# Patient Record
Sex: Female | Born: 1965 | Race: White | Hispanic: No | Smoking: Current every day smoker
Health system: Southern US, Community
[De-identification: ages and names within clinical notes are randomized; demographics above are authoritative.]

## PROBLEM LIST (undated history)

## (undated) DIAGNOSIS — F419 Anxiety disorder, unspecified: Secondary | ICD-10-CM

## (undated) DIAGNOSIS — B1920 Unspecified viral hepatitis C without hepatic coma: Secondary | ICD-10-CM

## (undated) DIAGNOSIS — M069 Rheumatoid arthritis, unspecified: Secondary | ICD-10-CM

## (undated) HISTORY — PX: KNEE SURGERY: SHX244

---

## 2005-02-17 ENCOUNTER — Emergency Department: Payer: Self-pay | Admitting: Emergency Medicine

## 2005-03-30 ENCOUNTER — Other Ambulatory Visit: Payer: Self-pay

## 2005-03-30 ENCOUNTER — Inpatient Hospital Stay: Payer: Self-pay | Admitting: Psychiatry

## 2006-01-11 ENCOUNTER — Inpatient Hospital Stay: Payer: Self-pay | Admitting: Psychiatry

## 2006-07-25 ENCOUNTER — Emergency Department: Payer: Self-pay | Admitting: Emergency Medicine

## 2006-07-29 ENCOUNTER — Emergency Department (HOSPITAL_COMMUNITY): Admission: EM | Admit: 2006-07-29 | Discharge: 2006-07-29 | Payer: Self-pay | Admitting: Emergency Medicine

## 2007-03-10 IMAGING — CR DG ELBOW COMPLETE 3+V*L*
1 series · 2 of 2 positions shown · non-contrast
Comparison: none

REASON FOR EXAM: Fall
COMMENTS:  LMP: > one month ago

PROCEDURE:     DXR - DXR ELBOW LT COMP W/OBLIQUES  - February 17, 2005  [DATE]
RESULT:     Four views of the LEFT elbow show no evidence of fracture,
dislocation, or radiopaque foreign body.

[Series 1: view not recorded · 0.17mm/px · 2 of 2 slices shown]
[im 1/2]
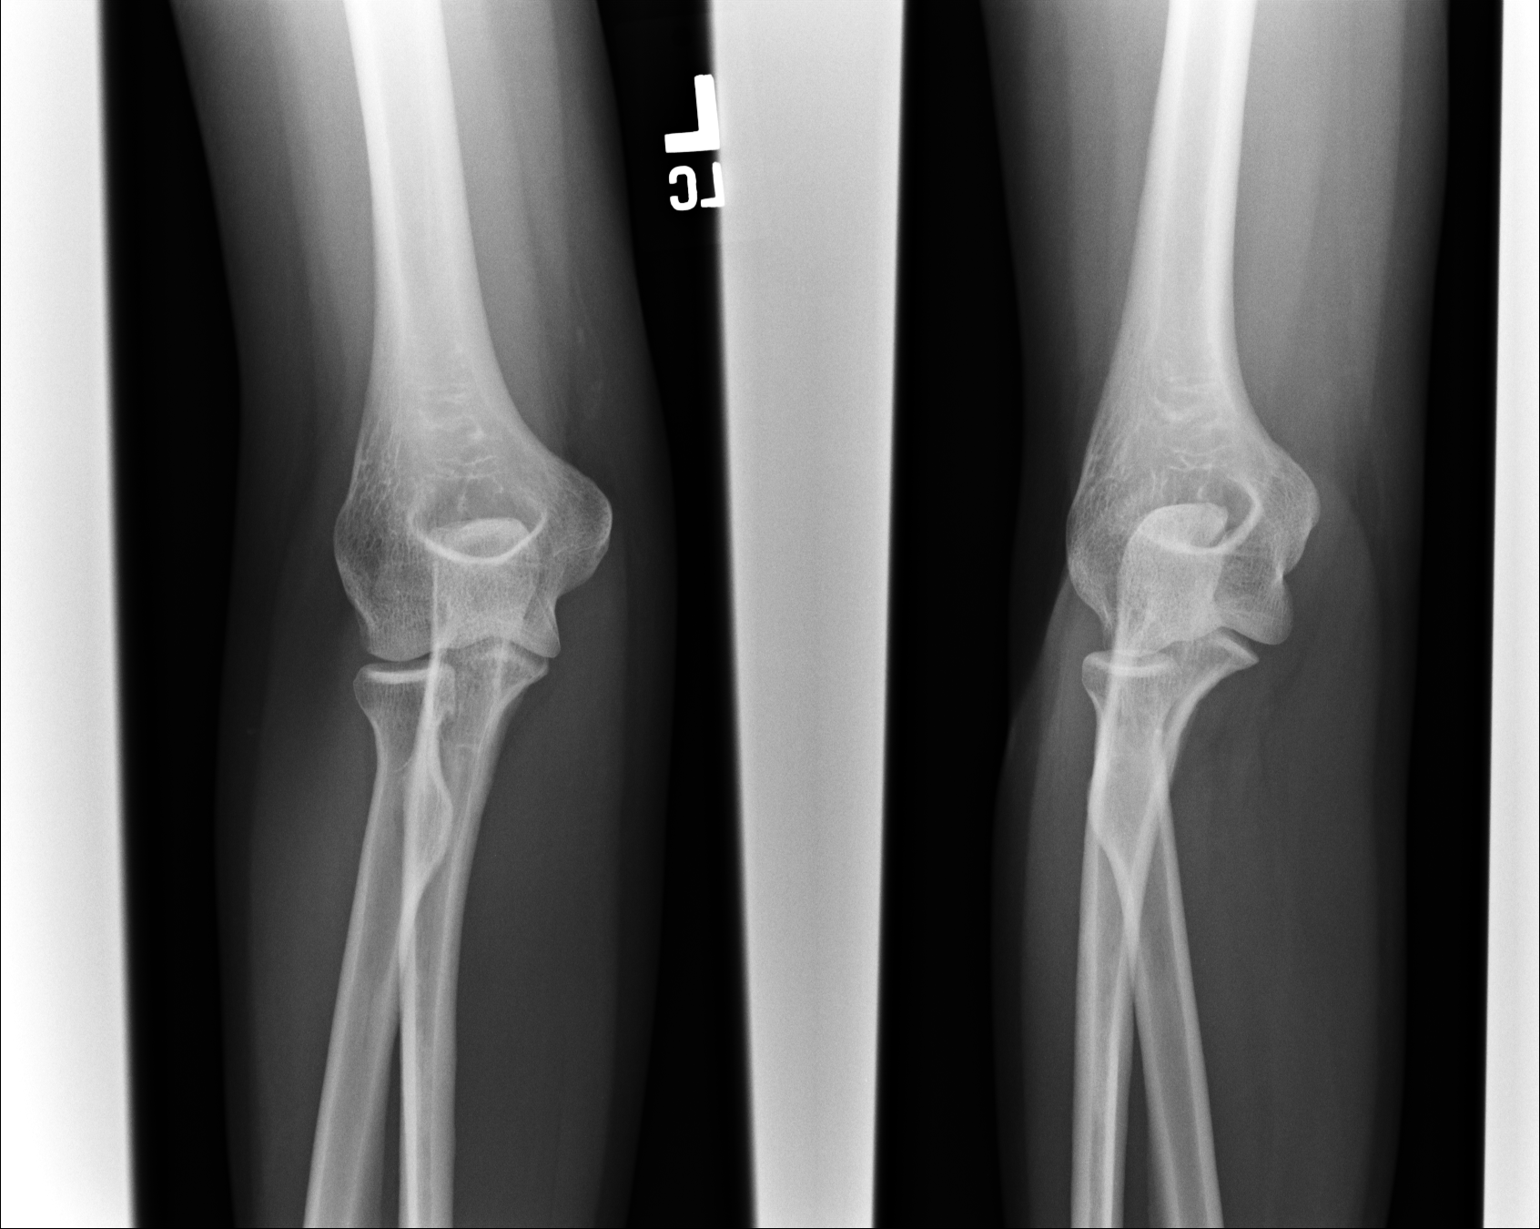
[im 2/2]
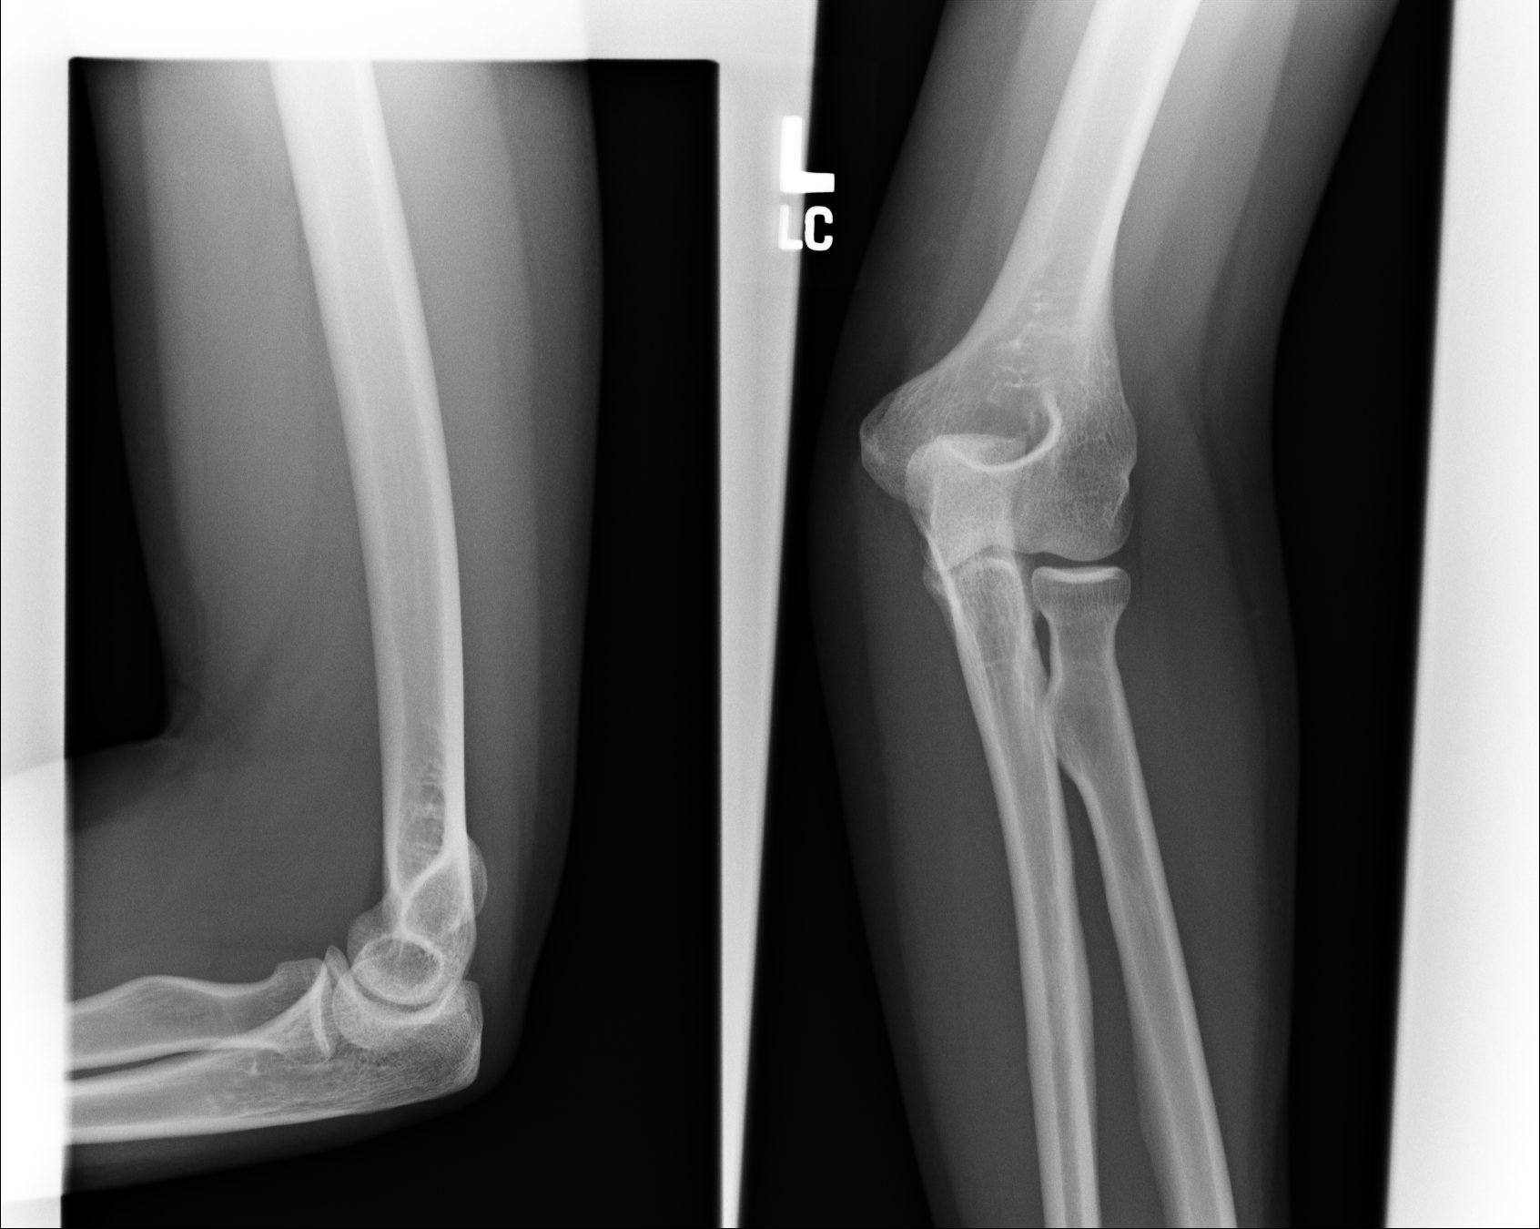

[2 of 2 positions shown; findings below may reference images not displayed]

IMPRESSION: As above.

## 2007-07-24 ENCOUNTER — Ambulatory Visit: Payer: Self-pay

## 2007-07-25 ENCOUNTER — Inpatient Hospital Stay: Payer: Self-pay

## 2013-08-09 ENCOUNTER — Emergency Department (HOSPITAL_BASED_OUTPATIENT_CLINIC_OR_DEPARTMENT_OTHER)
Admission: EM | Admit: 2013-08-09 | Discharge: 2013-08-09 | Disposition: A | Discharge status: Home / Self Care | Payer: Self-pay | Attending: Emergency Medicine | Admitting: Emergency Medicine

## 2013-08-09 ENCOUNTER — Encounter (HOSPITAL_BASED_OUTPATIENT_CLINIC_OR_DEPARTMENT_OTHER): Payer: Self-pay | Admitting: Emergency Medicine

## 2013-08-09 DIAGNOSIS — F10939 Alcohol use, unspecified with withdrawal, unspecified: Secondary | ICD-10-CM | POA: Insufficient documentation

## 2013-08-09 DIAGNOSIS — R110 Nausea: Secondary | ICD-10-CM | POA: Insufficient documentation

## 2013-08-09 DIAGNOSIS — Z8619 Personal history of other infectious and parasitic diseases: Secondary | ICD-10-CM | POA: Insufficient documentation

## 2013-08-09 DIAGNOSIS — F1120 Opioid dependence, uncomplicated: Secondary | ICD-10-CM | POA: Insufficient documentation

## 2013-08-09 DIAGNOSIS — F1920 Other psychoactive substance dependence, uncomplicated: Secondary | ICD-10-CM

## 2013-08-09 DIAGNOSIS — F1520 Other stimulant dependence, uncomplicated: Secondary | ICD-10-CM | POA: Insufficient documentation

## 2013-08-09 DIAGNOSIS — M069 Rheumatoid arthritis, unspecified: Secondary | ICD-10-CM | POA: Insufficient documentation

## 2013-08-09 DIAGNOSIS — Z8659 Personal history of other mental and behavioral disorders: Secondary | ICD-10-CM | POA: Insufficient documentation

## 2013-08-09 DIAGNOSIS — F1320 Sedative, hypnotic or anxiolytic dependence, uncomplicated: Secondary | ICD-10-CM | POA: Insufficient documentation

## 2013-08-09 DIAGNOSIS — F17200 Nicotine dependence, unspecified, uncomplicated: Secondary | ICD-10-CM | POA: Insufficient documentation

## 2013-08-09 DIAGNOSIS — F10239 Alcohol dependence with withdrawal, unspecified: Secondary | ICD-10-CM | POA: Insufficient documentation

## 2013-08-09 DIAGNOSIS — R52 Pain, unspecified: Secondary | ICD-10-CM | POA: Insufficient documentation

## 2013-08-09 HISTORY — DX: Unspecified viral hepatitis C without hepatic coma: B19.20

## 2013-08-09 HISTORY — DX: Rheumatoid arthritis, unspecified: M06.9

## 2013-08-09 HISTORY — DX: Anxiety disorder, unspecified: F41.9

## 2013-08-09 MED ORDER — CLONIDINE HCL 0.1 MG PO TABS: 0.1000 mg | ORAL_TABLET | Freq: Two times a day (BID) | ORAL | Status: AC

## 2013-08-09 MED ORDER — SODIUM CHLORIDE 0.9 % IV BOLUS (SEPSIS)
1000.0000 mL | Freq: Once | INTRAVENOUS | Status: AC
  Administered 2013-08-09: 1000 mL via INTRAVENOUS

## 2013-08-09 MED ORDER — CLONIDINE HCL 0.1 MG PO TABS
0.1000 mg | ORAL_TABLET | Freq: Once | ORAL | Status: AC
  Administered 2013-08-09: 0.1 mg via ORAL
  Filled 2013-08-09: qty 1

## 2013-08-09 NOTE — ED Provider Notes (Signed)
History/physical exam/procedure(s) were performed by non-physician practitioner and as supervising physician I was immediately available for consultation/collaboration. I have reviewed all notes and am in agreement with care and plan.   Hilario Quarry, MD 08/09/13 [phone]

## 2013-08-09 NOTE — ED Provider Notes (Signed)
CSN: [id number]     Arrival date & time 08/09/13  1204 History   First MD Initiated Contact with Patient 08/09/13 1216     Chief Complaint  Patient presents with  . Withdrawal   (Consider location/radiation/quality/duration/timing/severity/associated sxs/prior Treatment) HPI Comments: She is here from a treatment facility for drug and alcohol addiction where she has been for 7 days. 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 She reports she was taking up to 1 gram of valium daily and stopped abruptly when she went to treatment. Last night she started having symptoms of body aches and nausea without vomiting, similar to previous withdrawal syndrome she previously experienced. She comes in out of concern for possible seizures from benzodiazapine withdrawal, which she has also had in the past.   The history is provided by the patient. No language interpreter was used.    Past Medical History  Diagnosis Date  . Hepatitis C virus   . Rheumatoid arthritis   . Anxiety    History reviewed. No pertinent past surgical history. No family history on file. History  Substance Use Topics  . Smoking status: Current Every Day Smoker -- 0.50 packs/day    Types: Cigarettes  . Smokeless tobacco: Not on file  . Alcohol Use: Not on file   OB History   Grav Para Term Preterm Abortions TAB SAB Ect Mult Living                 Review of Systems  Constitutional: Negative for fever.  Respiratory: Negative for shortness of breath.   Cardiovascular: Negative for chest pain.  Gastrointestinal: Positive for nausea. Negative for vomiting and abdominal pain.  Musculoskeletal: Positive for myalgias.  Neurological: Negative for seizures, syncope and speech difficulty.  Psychiatric/Behavioral: Negative for confusion.    Allergies  Sulfa antibiotics  Home Medications   Current Outpatient Rx  Name  Route  Sig  Dispense  Refill  . ibuprofen (ADVIL,MOTRIN) 800 MG tablet   Oral   Take 800 mg by mouth every 8 (eight) hours as needed.         . MULTIPLE VITAMIN PO   Oral   Take by mouth.          BP 103/55  Pulse 89  Temp(Src) 98.6 F (37 C) (Oral)  Resp 18  SpO2 98% Physical Exam  Constitutional: She is oriented to person, place, and time. She appears well-developed and well-nourished.  HENT:  Head: Normocephalic.  Neck: Normal range of motion. Neck supple.  Cardiovascular: Normal rate and regular rhythm.   Pulmonary/Chest: Effort normal and breath sounds normal.  Abdominal: Soft. Bowel sounds are normal. There is no tenderness. There is no rebound and no guarding.  Musculoskeletal: Normal range of motion.  Neurological: She is alert and oriented to person, place, and time.  Skin: Skin is warm and dry. No rash noted.  Psychiatric: She has a normal mood and affect.    ED Course  Procedures (including critical care time) Labs Review Labs Reviewed - No data to display Imaging Review No results found.  EKG Interpretation   None       MDM  No diagnosis found. 1. Drug dependence.  She is feeling improved with IV fluids. Clonidine initiated and will continue BID dosing for the next 3 days. Consideration given to withdrawal from large doses of Valium, but given that she has abstained for a full 7 days, do not feel a taper with BZD's is appropriate or beneficial. Stable for  discharge back to treatment facility.    Arnoldo Hooker, PA-C 08/09/13 1504

## 2013-08-09 NOTE — Discharge Instructions (Signed)
RETURN HERE AS NEEDED FOR FURTHER EVALUATION AND TREATMENT IF YOU DEVELOP OTHER CONCERNING SYMPTOMS. TAKE CLONIDINE AS DIRECTED.

## 2013-08-09 NOTE — ED Notes (Addendum)
Patient here from a recovery center in [place] x 1 week. There for opiates and valium withdrawal. Developed nausea, body aches, weakness last pm. Patient thinks related to the valium usage. Was taking 1mg  valium daily for the past year. Patient also reports that she has used IV Dilaudid, tussinex. Last use 10 days ago for the opiates. Patient denies suicidal, homicidal thoughts

## 2014-03-21 ENCOUNTER — Emergency Department: Payer: Self-pay | Admitting: Internal Medicine

## 2014-04-06 ENCOUNTER — Emergency Department: Payer: Self-pay | Admitting: Emergency Medicine

## 2014-04-19 ENCOUNTER — Emergency Department (HOSPITAL_COMMUNITY)
Admission: EM | Admit: 2014-04-19 | Discharge: 2014-04-19 | Disposition: A | Discharge status: Home / Self Care | Payer: Self-pay | Attending: Emergency Medicine | Admitting: Emergency Medicine

## 2014-04-19 ENCOUNTER — Encounter (HOSPITAL_COMMUNITY): Payer: Self-pay | Admitting: Emergency Medicine

## 2014-04-19 DIAGNOSIS — K1379 Other lesions of oral mucosa: Secondary | ICD-10-CM | POA: Insufficient documentation

## 2014-04-19 DIAGNOSIS — M069 Rheumatoid arthritis, unspecified: Secondary | ICD-10-CM | POA: Insufficient documentation

## 2014-04-19 DIAGNOSIS — Z720 Tobacco use: Secondary | ICD-10-CM | POA: Insufficient documentation

## 2014-04-19 DIAGNOSIS — K029 Dental caries, unspecified: Secondary | ICD-10-CM | POA: Insufficient documentation

## 2014-04-19 DIAGNOSIS — F419 Anxiety disorder, unspecified: Secondary | ICD-10-CM | POA: Insufficient documentation

## 2014-04-19 DIAGNOSIS — K088 Other specified disorders of teeth and supporting structures: Secondary | ICD-10-CM | POA: Insufficient documentation

## 2014-04-19 DIAGNOSIS — Z8619 Personal history of other infectious and parasitic diseases: Secondary | ICD-10-CM | POA: Insufficient documentation

## 2014-04-19 DIAGNOSIS — K0889 Other specified disorders of teeth and supporting structures: Secondary | ICD-10-CM

## 2014-04-19 MED ORDER — HYDROCODONE-ACETAMINOPHEN 5-325 MG PO TABS
2.0000 | ORAL_TABLET | Freq: Once | ORAL | Status: AC
  Administered 2014-04-19: 2 via ORAL
  Filled 2014-04-19: qty 2

## 2014-04-19 MED ORDER — TRAMADOL HCL 50 MG PO TABS: 50.0000 mg | ORAL_TABLET | Freq: Four times a day (QID) | ORAL | Status: DC | PRN

## 2014-04-19 NOTE — ED Notes (Signed)
Declined W/C at D/C and was escorted to lobby by RN. 
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
## 2014-04-19 NOTE — ED Notes (Signed)
Pt has a canker sore that has been irritated by a broken tooth. Is going to see a dental school this week.

## 2014-04-19 NOTE — ED Provider Notes (Signed)
Medical screening examination/treatment/procedure(s) were performed by non-physician practitioner and as supervising physician I was immediately available for consultation/collaboration.   EKG Interpretation None        Princeston Blizzard, MD 04/19/14 1758 

## 2014-04-19 NOTE — Discharge Instructions (Signed)

## 2014-07-07 ENCOUNTER — Emergency Department: Payer: Self-pay | Admitting: Internal Medicine

## 2015-08-14 ENCOUNTER — Emergency Department
Admission: EM | Admit: 2015-08-14 | Discharge: 2015-08-14 | Disposition: A | Discharge status: Home / Self Care | Payer: Self-pay | Attending: Emergency Medicine | Admitting: Emergency Medicine

## 2015-08-14 ENCOUNTER — Encounter: Payer: Self-pay | Admitting: Emergency Medicine

## 2015-08-14 DIAGNOSIS — M5442 Lumbago with sciatica, left side: Secondary | ICD-10-CM | POA: Insufficient documentation

## 2015-08-14 DIAGNOSIS — F17210 Nicotine dependence, cigarettes, uncomplicated: Secondary | ICD-10-CM | POA: Insufficient documentation

## 2015-08-14 DIAGNOSIS — Z79899 Other long term (current) drug therapy: Secondary | ICD-10-CM | POA: Insufficient documentation

## 2015-08-14 DIAGNOSIS — G8929 Other chronic pain: Secondary | ICD-10-CM | POA: Insufficient documentation

## 2015-08-14 DIAGNOSIS — G5603 Carpal tunnel syndrome, bilateral upper limbs: Secondary | ICD-10-CM | POA: Insufficient documentation

## 2015-08-14 DIAGNOSIS — M5441 Lumbago with sciatica, right side: Secondary | ICD-10-CM | POA: Insufficient documentation

## 2015-08-14 DIAGNOSIS — K0263 Dental caries on smooth surface penetrating into pulp: Secondary | ICD-10-CM | POA: Insufficient documentation

## 2015-08-14 DIAGNOSIS — K08409 Partial loss of teeth, unspecified cause, unspecified class: Secondary | ICD-10-CM | POA: Insufficient documentation

## 2015-08-14 DIAGNOSIS — K029 Dental caries, unspecified: Secondary | ICD-10-CM

## 2015-08-14 DIAGNOSIS — K089 Disorder of teeth and supporting structures, unspecified: Secondary | ICD-10-CM

## 2015-08-14 MED ORDER — MAGIC MOUTHWASH W/LIDOCAINE: 5.0000 mL | Freq: Four times a day (QID) | ORAL | Status: AC

## 2015-08-14 MED ORDER — MELOXICAM 15 MG PO TABS: 15.0000 mg | ORAL_TABLET | Freq: Every day | ORAL | Status: AC

## 2015-08-14 NOTE — Discharge Instructions (Signed)
Back Exercises °The following exercises strengthen the muscles that help to support the back. They also help to keep the lower back flexible. Doing these exercises can help to prevent back pain or lessen existing pain. °If you have back pain or discomfort, try doing these exercises 2-3 times each day or as told by your health care provider. When the pain goes away, do them once each day, but increase the number of times that you repeat the steps for each exercise (do more repetitions). If you do not have back pain or discomfort, do these exercises once each day or as told by your health care provider. °EXERCISES °Single Knee to Chest °Repeat these steps 3-5 times for each leg: °· Lie on your back on a firm bed or the floor with your legs extended. °· Bring one knee to your chest. Your other leg should stay extended and in contact with the floor. °· Hold your knee in place by grabbing your knee or thigh. °· Pull on your knee until you feel a gentle stretch in your lower back. °· Hold the stretch for 10-30 seconds. °· Slowly release and straighten your leg. °Pelvic Tilt °Repeat these steps 5-10 times: °· Lie on your back on a firm bed or the floor with your legs extended. °· Bend your knees so they are pointing toward the ceiling and your feet are flat on the floor. °· Tighten your lower abdominal muscles to press your lower back against the floor. This motion will tilt your pelvis so your tailbone points up toward the ceiling instead of pointing to your feet or the floor. °· With gentle tension and even breathing, hold this position for 5-10 seconds. °Cat-Cow °Repeat these steps until your lower back becomes more flexible: °· Get into a hands-and-knees position on a firm surface. Keep your hands under your shoulders, and keep your knees under your hips. You may place padding under your knees for comfort. °· Let your head hang down, and point your tailbone toward the floor so your lower back becomes rounded like the  back of a cat. °· Hold this position for 5 seconds. °· Slowly lift your head and point your tailbone up toward the ceiling so your back forms a sagging arch like the back of a cow. °· Hold this position for 5 seconds. °Press-Ups °Repeat these steps 5-10 times: °· Lie on your abdomen (face-down) on the floor. °· Place your palms near your head, about shoulder-width apart. °· While you keep your back as relaxed as possible and keep your hips on the floor, slowly straighten your arms to raise the top half of your body and lift your shoulders. Do not use your back muscles to raise your upper torso. You may adjust the placement of your hands to make yourself more comfortable. °· Hold this position for 5 seconds while you keep your back relaxed. °· Slowly return to lying flat on the floor. °Bridges °Repeat these steps 10 times: °· Lie on your back on a firm surface. °· Bend your knees so they are pointing toward the ceiling and your feet are flat on the floor. °· Tighten your buttocks muscles and lift your buttocks off of the floor until your waist is at almost the same height as your knees. You should feel the muscles working in your buttocks and the back of your thighs. If you do not feel these muscles, slide your feet 1-2 inches farther away from your buttocks. °· Hold this position for 3-5   seconds.  Slowly lower your hips to the starting position, and allow your buttocks muscles to relax completely. If this exercise is too easy, try doing it with your arms crossed over your chest. Abdominal Crunches Repeat these steps 5-10 times: 1. Lie on your back on a firm bed or the floor with your legs extended. 2. Bend your knees so they are pointing toward the ceiling and your feet are flat on the floor. 3. Cross your arms over your chest. 4. Tip your chin slightly toward your chest without bending your neck. 5. Tighten your abdominal muscles and slowly raise your trunk (torso) high enough to lift your shoulder blades  a tiny bit off of the floor. Avoid raising your torso higher than that, because it can put too much stress on your low back and it does not help to strengthen your abdominal muscles. 6. Slowly return to your starting position. Back Lifts Repeat these steps 5-10 times: 1. Lie on your abdomen (face-down) with your arms at your sides, and rest your forehead on the floor. 2. Tighten the muscles in your legs and your buttocks. 3. Slowly lift your chest off of the floor while you keep your hips pressed to the floor. Keep the back of your head in line with the curve in your back. Your eyes should be looking at the floor. 4. Hold this position for 3-5 seconds. 5. Slowly return to your starting position. SEEK MEDICAL CARE IF:  Your back pain or discomfort gets much worse when you do an exercise.  Your back pain or discomfort does not lessen within 2 hours after you exercise. If you have any of these problems, stop doing these exercises right away. Do not do them again unless your health care provider says that you can. SEEK IMMEDIATE MEDICAL CARE IF:  You develop sudden, severe back pain. If this happens, stop doing the exercises right away. Do not do them again unless your health care provider says that you can.   This information is not intended to replace advice given to you by your health care provider. Make sure you discuss any questions you have with your health care provider.   Document Released: 08/03/2004 Document Revised: 03/17/2015 Document Reviewed: 08/20/2014 Elsevier Interactive Patient Education 2016 [employer]  Chronic Back Pain  When back pain lasts longer than 3 months, it is called chronic back pain.People with chronic back pain often go through certain periods that are more intense (flare-ups).  CAUSES Chronic back pain can be caused by wear and tear (degeneration) on different structures in your back. These structures include:  The bones of your spine (vertebrae) and the  joints surrounding your spinal cord and nerve roots (facets).  The strong, fibrous tissues that connect your vertebrae (ligaments). Degeneration of these structures may result in pressure on your nerves. This can lead to constant pain. HOME CARE INSTRUCTIONS  Avoid bending, heavy lifting, prolonged sitting, and activities which make the problem worse.  Take brief periods of rest throughout the day to reduce your pain. Lying down or standing usually is better than sitting while you are resting.  Take over-the-counter or prescription medicines only as directed by your caregiver. SEEK IMMEDIATE MEDICAL CARE IF:   You have weakness or numbness in one of your legs or feet.  You have trouble controlling your bladder or bowels.  You have nausea, vomiting, abdominal pain, shortness of breath, or fainting.   This information is not intended to replace advice given to you by  your health care provider. Make sure you discuss any questions you have with your health care provider.   Document Released: 08/03/2004 Document Revised: 09/18/2011 Document Reviewed: 12/14/2014 Elsevier Interactive Patient Education 2016 [employer]  Dental Caries Dental caries (also called tooth decay) is the most common oral disease. It can occur at any age but is more common in children and young adults.  HOW DENTAL CARIES DEVELOPS  The process of decay begins when bacteria and foods (particularly sugars and starches) combine in your mouth to produce plaque. Plaque is a substance that sticks to the hard, outer surface of a tooth (enamel). The bacteria in plaque produce acids that attack enamel. These acids may also attack the root surface of a tooth (cementum) if it is exposed. Repeated attacks dissolve these surfaces and create holes in the tooth (cavities). If left untreated, the acids destroy the other layers of the tooth.  RISK FACTORS  Frequent sipping of sugary beverages.   Frequent snacking on sugary and  starchy foods, especially those that easily get stuck in the teeth.   Poor oral hygiene.   Dry mouth.   Substance abuse such as methamphetamine abuse.   Broken or poor-fitting dental restorations.   Eating disorders.   Gastroesophageal reflux disease (GERD).   Certain radiation treatments to the head and neck. SYMPTOMS In the early stages of dental caries, symptoms are seldom present. Sometimes white, chalky areas may be seen on the enamel or other tooth layers. In later stages, symptoms may include:  Pits and holes on the enamel.  Toothache after sweet, hot, or cold foods or drinks are consumed.  Pain around the tooth.  Swelling around the tooth. DIAGNOSIS  Most of the time, dental caries is detected during a regular dental checkup. A diagnosis is made after a thorough medical and dental history is taken and the surfaces of your teeth are checked for signs of dental caries. Sometimes special instruments, such as lasers, are used to check for dental caries. Dental X-ray exams may be taken so that areas not visible to the eye (such as between the contact areas of the teeth) can be checked for cavities.  TREATMENT  If dental caries is in its early stages, it may be reversed with a fluoride treatment or an application of a remineralizing agent at the dental office. Thorough brushing and flossing at home is needed to aid these treatments. If it is in its later stages, treatment depends on the location and extent of tooth destruction:   If a small area of the tooth has been destroyed, the destroyed area will be removed and cavities will be filled with a material such as gold, silver amalgam, or composite resin.   If a large area of the tooth has been destroyed, the destroyed area will be removed and a cap (crown) will be fitted over the remaining tooth structure.   If the center part of the tooth (pulp) is affected, a procedure called a root canal will be needed before a filling  or crown can be placed.   If most of the tooth has been destroyed, the tooth may need to be pulled (extracted). HOME CARE INSTRUCTIONS You can prevent, stop, or reverse dental caries at home by practicing good oral hygiene. Good oral hygiene includes:  Thoroughly cleaning your teeth at least twice a day with a toothbrush and dental floss.   Using a fluoride toothpaste. A fluoride mouth rinse may also be used if recommended by your dentist or health  care provider.   Restricting the amount of sugary and starchy foods and sugary liquids you consume.   Avoiding frequent snacking on these foods and sipping of these liquids.   Keeping regular visits with a dentist for checkups and cleanings. PREVENTION   Practice good oral hygiene.  Consider a dental sealant. A dental sealant is a coating material that is applied by your dentist to the pits and grooves of teeth. The sealant prevents food from being trapped in them. It may protect the teeth for several years.  Ask about fluoride supplements if you live in a community without fluorinated water or with water that has a low fluoride content. Use fluoride supplements as directed by your dentist or health care provider.  Allow fluoride varnish applications to teeth if directed by your dentist or health care provider.   This information is not intended to replace advice given to you by your health care provider. Make sure you discuss any questions you have with your health care provider.   Document Released: 03/18/2002 Document Revised: 07/17/2014 Document Reviewed: 06/28/2012 Elsevier Interactive Patient Education 2016 [employer]  Dental Care and Dentist Visits Dental care supports good overall health. Regular dental visits can also help you avoid dental pain, bleeding, infection, and other more serious health problems in the future. It is important to keep the mouth healthy because diseases in the teeth, gums, and other oral tissues can  spread to other areas of the body. Some problems, such as diabetes, heart disease, and pre-term labor have been associated with poor oral health.  See your dentist every 6 months. If you experience emergency problems such as a toothache or broken tooth, go to the dentist right away. If you see your dentist regularly, you may catch problems early. It is easier to be treated for problems in the early stages.  WHAT TO EXPECT AT A DENTIST VISIT  Your dentist will look for many common oral health problems and recommend proper treatment. At your regular dental visit, you can expect:  Gentle cleaning of the teeth and gums. This includes scraping and polishing. This helps to remove the sticky substance around the teeth and gums (plaque). Plaque forms in the mouth shortly after eating. Over time, plaque hardens on the teeth as tartar. If tartar is not removed regularly, it can cause problems. Cleaning also helps remove stains.  Periodic X-rays. These pictures of the teeth and supporting bone will help your dentist assess the health of your teeth.  Periodic fluoride treatments. Fluoride is a natural mineral shown to help strengthen teeth. Fluoride treatmentinvolves applying a fluoride gel or varnish to the teeth. It is most commonly done in children.  Examination of the mouth, tongue, jaws, teeth, and gums to look for any oral health problems, such as:  Cavities (dental caries). This is decay on the tooth caused by plaque, sugar, and acid in the mouth. It is best to catch a cavity when it is small.  Inflammation of the gums caused by plaque buildup (gingivitis).  Problems with the mouth or malformed or misaligned teeth.  Oral cancer or other diseases of the soft tissues or jaws. KEEP YOUR TEETH AND GUMS HEALTHY For healthy teeth and gums, follow these general guidelines as well as your dentist's specific advice:  Have your teeth professionally cleaned at the dentist every 6 months.  Brush twice  daily with a fluoride toothpaste.  Floss your teeth daily.  Ask your dentist if you need fluoride supplements, treatments, or fluoride toothpaste.  Eat a healthy diet. Reduce foods and drinks with added sugar.  Avoid smoking. TREATMENT FOR ORAL HEALTH PROBLEMS If you have oral health problems, treatment varies depending on the conditions present in your teeth and gums.  Your caregiver will most likely recommend good oral hygiene at each visit.  For cavities, gingivitis, or other oral health disease, your caregiver will perform a procedure to treat the problem. This is typically done at a separate appointment. Sometimes your caregiver will refer you to another dental specialist for specific tooth problems or for surgery. SEEK IMMEDIATE DENTAL CARE IF:  You have pain, bleeding, or soreness in the gum, tooth, jaw, or mouth area.  A permanent tooth becomes loose or separated from the gum socket.  You experience a blow or injury to the mouth or jaw area.   This information is not intended to replace advice given to you by your health care provider. Make sure you discuss any questions you have with your health care provider.  Carpal Tunnel Syndrome    Carpal tunnel syndrome is a condition that causes pain in your hand and arm. The carpal tunnel is a narrow area located on the palm side of your wrist. Repeated wrist motion or certain diseases may cause swelling within the tunnel. This swelling pinches the main nerve in the wrist (median nerve).  CAUSES  This condition may be caused by:  Repeated wrist motions.  Wrist injuries.  Arthritis.  A cyst or tumor in the carpal tunnel.  Fluid buildup during pregnancy. Sometimes the cause of this condition is not known.  RISK FACTORS  This condition is more likely to develop in:  People who have jobs that cause them to repeatedly move their wrists in the same motion, such as butchers and cashiers.  Women.  People with certain conditions,  such as:  Diabetes.  Obesity.  An underactive thyroid (hypothyroidism).  Kidney failure. SYMPTOMS  Symptoms of this condition include:  A tingling feeling in your fingers, especially in your thumb, index, and middle fingers.  Tingling or numbness in your hand.  An aching feeling in your entire arm, especially when your wrist and elbow are bent for long periods of time.  Wrist pain that goes up your arm to your shoulder.  Pain that goes down into your palm or fingers.  A weak feeling in your hands. You may have trouble grabbing and holding items. Your symptoms may feel worse during the night.  DIAGNOSIS  This condition is diagnosed with a medical history and physical exam. You may also have tests, including:  An electromyogram (EMG). This test measures electrical signals sent by your nerves into the muscles.  X-rays. TREATMENT  Treatment for this condition includes:  Lifestyle changes. It is important to stop doing or modify the activity that caused your condition.  Physical or occupational therapy.  Medicines for pain and inflammation. This may include medicine that is injected into your wrist.  A wrist splint.  Surgery. HOME CARE INSTRUCTIONS  If You Have a Splint:  Wear it as told by your health care provider. Remove it only as told by your health care provider.  Loosen the splint if your fingers become numb and tingle, or if they turn cold and blue.  Keep the splint clean and dry. General Instructions  Take over-the-counter and prescription medicines only as told by your health care provider.  Rest your wrist from any activity that may be causing your pain. If your condition is work related, talk to  your employer about changes that can be made, such as getting a wrist pad to use while typing.  If directed, apply ice to the painful area:  Put ice in a plastic bag.  Place a towel between your skin and the bag.  Leave the ice on for 20 minutes, 2-3 times per day. Keep all  follow-up visits as told by your health care provider. This is important.  Do any exercises as told by your health care provider, physical therapist, or occupational therapist. SEEK MEDICAL CARE IF:  You have new symptoms.  Your pain is not controlled with medicines.  Your symptoms get worse. This information is not intended to replace advice given to you by your health care provider. Make sure you discuss any questions you have with your health care provider.  Document Released: 06/23/2000 Document Revised: 03/17/2015 Document Reviewed: 11/11/2014  [employer] Interactive Patient Education 2016 [employer]     Document Released: 03/08/2011 Document Revised: 09/18/2011 Document Reviewed: 03/08/2011 [employer] Interactive Patient Education [employer]

## 2015-08-14 NOTE — ED Provider Notes (Signed)
[facility] Emergency Department Provider Note  ____________________________________________  Time seen: Approximately 11:38 AM  I have reviewed the triage vital signs and the nursing notes.   HISTORY  Chief Complaint Back Pain    HPI Jacqueline Cross is a 50 y.o. female who presents emergency Department with multiple medical complaints. Patient states that she has been having lower back pain for 6-7 months. 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 She has intermittent bilateral radicular symptoms but denies any radicular symptoms at this time. She states it's an aching sensation always in the middle of her back. She denies any injury precipitating this pain. Patient also complains of bilateral wrist pain and numbness and tingling in her fingers wakes her up at night. She states that she does repetitive motions at work. She also is endorsing dental pain from known poor dentition. Patient is establishing with [facility] to have a dentist appointmentto address dentition. Patient denies any sudden changes in any of these symptoms. Pain complaints are chronic in nature. She has not taken any medications prior to arrival. She denies fevers or chills, headache, visual acuity changes, chest pain, shortness of breath, abdominal pain, nausea or vomiting, saddle anesthesia, radicular symptoms at this time.   Past Medical History  Diagnosis Date  . Hepatitis C virus   . Rheumatoid arthritis (HCC)   . Anxiety     There are no active problems to display for this patient.   Past Surgical History  Procedure Laterality Date  . Cesarean section      x 4  . Knee surgery Left     Current Outpatient Rx  Name  Route  Sig  Dispense  Refill  . cloNIDine (CATAPRES) 0.1 MG tablet   Oral   Take 1 tablet (0.1 mg total) by mouth 2 (two) times daily.   5 tablet   0   . ibuprofen (ADVIL,MOTRIN) 800 MG tablet   Oral   Take 800 mg by  mouth every 8 (eight) hours as needed.         . magic mouthwash w/lidocaine SOLN   Oral   Take 5 mLs by mouth 4 (four) times daily.   240 mL   0     Dispense in a 1/1/1/1 ratio. Use lidocaine, diphen ...   . meloxicam (MOBIC) 15 MG tablet   Oral   Take 1 tablet (15 mg total) by mouth daily.   30 tablet   0   . MULTIPLE VITAMIN PO   Oral   Take by mouth.         . traMADol (ULTRAM) 50 MG tablet   Oral   Take 1 tablet (50 mg total) by mouth every 6 (six) hours as needed.   15 tablet   0     Allergies Sulfa antibiotics  History reviewed. No pertinent family history.  Social History Social History  Substance Use Topics  . Smoking status: Current Every Day Smoker -- 0.50 packs/day    Types: Cigarettes  . Smokeless tobacco: None  . Alcohol Use: Yes     Review of Systems  Constitutional: No fever/chills Eyes: No visual changes.  ENT: Positive for dental pain Cardiovascular: no chest pain. Respiratory:  No SOB. Genitourinary: Negative for dysuria. No hematuria Musculoskeletal: Positive for back pain. Positive for bilateral wrist pain Skin: Negative for rash. Neurological: Negative for headaches, focal weakness or numbness. 10-point ROS otherwise negative.  ____________________________________________  PHYSICAL EXAM:  VITAL SIGNS: ED Triage Vitals  Enc Vitals Group     BP 08/14/15 1119 121/65 mmHg     Pulse Rate 08/14/15 1119 82     Resp 08/14/15 1119 16     Temp 08/14/15 1119 97.9 F (36.6 C)     Temp src --      SpO2 08/14/15 1119 98 %     Weight 08/14/15 1119 130 lb (58.968 kg)     Height 08/14/15 1119 5\' 5"  (1.651 m)     Head Cir --      Peak Flow --      Pain Score 08/14/15 1120 8     Pain Loc --      Pain Edu? --      Excl. in GC? --      Constitutional: Alert and oriented. Well appearing and in no acute distress. Eyes: Conjunctivae are normal. PERRL. EOMI. Head: Atraumatic. ENT:      Mouth/Throat: Mucous membranes are moist.  Poor dentition throughout mouth with multiple missing teeth, multiple caries. Neck: No stridor.  No cervical spine tenderness to palpation Hematological/Lymphatic/Immunilogical: No cervical lymphadenopathy. Cardiovascular: Normal rate, regular rhythm. Normal S1 and S2.  Good peripheral circulation. Respiratory: Normal respiratory effort without tachypnea or retractions. Lungs CTAB. Gastrointestinal: Soft and nontender. No distention. No CVA tenderness. Musculoskeletal: No lower extremity tenderness nor edema.  No joint effusions. No visible deformity to bilateral wrists. Full range of motion wrist. Radial pulse intact bilaterally. Sensation intact 5 digits. Negative Tinel's and positive Phalen's. No visible deformity to spine upon inspection. Patient is diffusely tender to palpation over the lumbar spine. Nontender to palpation over her bilateral sciatic notches. Positive straight leg raise bilaterally. Dorsalis pedis pulse intact bilaterally. Sensation intact and equal lower extremities. Neurologic:  Normal speech and language. No gross focal neurologic deficits are appreciated.  Skin:  Skin is warm, dry and intact. No rash noted. Psychiatric: Mood and affect are normal. Speech and behavior are normal. Patient exhibits appropriate insight and judgement.   ____________________________________________   LABS (all labs ordered are listed, but only abnormal results are displayed)  Labs Reviewed - No data to display ____________________________________________  EKG   ____________________________________________  RADIOLOGY   No results found.  ____________________________________________    PROCEDURES  Procedure(s) performed:    SPLINT APPLICATION Date/Time: 11:56 AM Authorized by: Racheal Patches Consent: Verbal consent obtained. Risks and benefits: risks, benefits and alternatives were discussed Consent given by: patient Splint applied by: orthopedic  technician Location details: Right wrist  Splint type: Wrist cockup  Supplies used: Velcro wrist brace  Post-procedure: The splinted body part was neurovascularly unchanged following the procedure. Patient tolerance: Patient tolerated the procedure well with no immediate complications.   SPLINT APPLICATION Date/Time: 11:56 AM Authorized by: Racheal Patches Consent: Verbal consent obtained. Risks and benefits: risks, benefits and alternatives were discussed Consent given by: patient Splint applied by: orthopedic technician Location details: Left wrist  Splint type: Wrist cockup  Supplies used: Velcro wrist brace  Post-procedure: The splinted body part was neurovascularly unchanged following the procedure. Patient tolerance: Patient tolerated the procedure well with no immediate complications.      Medications - No data to display   ____________________________________________   INITIAL IMPRESSION / ASSESSMENT AND PLAN / ED COURSE  Pertinent labs & imaging results that were available during my care of the patient were reviewed by me and considered in my medical decision making (see chart for details).  Patient's diagnosis is consistent  with poor dentition with dental erosions and dental caries, bilateral carpal tunnel syndrome, lumbago with intermittent sciatica.. Patient will be discharged home with prescriptions for anti-inflammatories for symptom control of carpal tunnel and lower back pain. She'll be given Magic mouthwash for dental complaints. Patient is to follow up with orthopedic surgeon as well as dentist if symptoms persist past this treatment course. Patient is given ED precautions to return to the ED for any worsening or new symptoms.     ____________________________________________  FINAL CLINICAL IMPRESSION(S) / ED DIAGNOSES  Final diagnoses:  Poor dentition  Dental caries extending into pulp  Bilateral carpal tunnel syndrome  Chronic midline low back  pain with bilateral sciatica      NEW MEDICATIONS STARTED DURING THIS VISIT:  New Prescriptions   MAGIC MOUTHWASH W/LIDOCAINE SOLN    Take 5 mLs by mouth 4 (four) times daily.   MELOXICAM (MOBIC) 15 MG TABLET    Take 1 tablet (15 mg total) by mouth daily.       Delorise Royals Cuthriell, PA-C 08/14/15 1156  Jene Every, MD 08/14/15 [phone]

## 2015-08-14 NOTE — ED Notes (Signed)
C/o sore body from pinched nerves in her back, stress from mother dying and bad teeth that make it so she can't eat. Has follow up with dentist

## 2015-09-30 ENCOUNTER — Emergency Department: Payer: Self-pay

## 2015-09-30 ENCOUNTER — Emergency Department
Admission: EM | Admit: 2015-09-30 | Discharge: 2015-09-30 | Disposition: A | Discharge status: Home / Self Care | Payer: Self-pay | Attending: Emergency Medicine | Admitting: Emergency Medicine

## 2015-09-30 ENCOUNTER — Encounter: Payer: Self-pay | Admitting: Emergency Medicine

## 2015-09-30 DIAGNOSIS — F17210 Nicotine dependence, cigarettes, uncomplicated: Secondary | ICD-10-CM | POA: Insufficient documentation

## 2015-09-30 DIAGNOSIS — M069 Rheumatoid arthritis, unspecified: Secondary | ICD-10-CM | POA: Insufficient documentation

## 2015-09-30 DIAGNOSIS — Z791 Long term (current) use of non-steroidal anti-inflammatories (NSAID): Secondary | ICD-10-CM | POA: Insufficient documentation

## 2015-09-30 DIAGNOSIS — Z79899 Other long term (current) drug therapy: Secondary | ICD-10-CM | POA: Insufficient documentation

## 2015-09-30 DIAGNOSIS — J189 Pneumonia, unspecified organism: Secondary | ICD-10-CM | POA: Insufficient documentation

## 2015-09-30 MED ORDER — IBUPROFEN 800 MG PO TABS: 800.0000 mg | ORAL_TABLET | Freq: Three times a day (TID) | ORAL | Status: AC | PRN

## 2015-09-30 MED ORDER — LEVOFLOXACIN 500 MG PO TABS: 500.0000 mg | ORAL_TABLET | Freq: Every day | ORAL | Status: AC

## 2015-09-30 MED ORDER — GUAIFENESIN-CODEINE 100-10 MG/5ML PO SOLN
10.0000 mL | ORAL | Status: DC | PRN
Start: 2015-09-30 — End: 2015-10-04

## 2015-09-30 NOTE — ED Provider Notes (Signed)
[facility] Emergency Department Provider Note  ____________________________________________  Time seen: Approximately 11:26 AM  I have reviewed the triage vital signs and the nursing notes.   HISTORY  Chief Complaint Nasal Congestion and Cough    HPI Jacqueline Cross is a 50 y.o. female presents for evaluation of cough congestion and fever for the last 2 days. Patient also reports having some chest discomfort with deep breathing. Patient states husband and recently hospitalized for blood clots. Had been hospitalized times one 6.   Past Medical History  Diagnosis Date  . Hepatitis C virus   . Rheumatoid arthritis (HCC)   . Anxiety     There are no active problems to display for this patient.   Past Surgical History  Procedure Laterality Date  . Cesarean section      x 4  . Knee surgery Left     Current Outpatient Rx  Name  Route  Sig  Dispense  Refill  . cloNIDine (CATAPRES) 0.1 MG tablet   Oral   Take 1 tablet (0.1 mg total) by mouth 2 (two) times daily.   5 tablet   0   . guaiFENesin-codeine 100-10 MG/5ML syrup   Oral   Take 10 mLs by mouth every 4 (four) hours as needed for cough.   180 mL   0   . ibuprofen (ADVIL,MOTRIN) 800 MG tablet   Oral   Take 1 tablet (800 mg total) by mouth every 8 (eight) hours as needed.   30 tablet   0   . levofloxacin (LEVAQUIN) 500 MG tablet   Oral   Take 1 tablet (500 mg total) by mouth daily.   10 tablet   0   . magic mouthwash w/lidocaine SOLN   Oral   Take 5 mLs by mouth 4 (four) times daily.   240 mL   0     Dispense in a 1/1/1/1 ratio. Use lidocaine, diphen ...   . meloxicam (MOBIC) 15 MG tablet   Oral   Take 1 tablet (15 mg total) by mouth daily.   30 tablet   0   . MULTIPLE VITAMIN PO   Oral   Take by mouth.         . traMADol (ULTRAM) 50 MG tablet   Oral   Take 1 tablet (50 mg total) by mouth every 6 (six) hours as needed.   15 tablet   0     Allergies Sulfa  antibiotics  History reviewed. No pertinent family history.  Social History Social History  Substance Use Topics  . Smoking status: Current Every Day Smoker -- 0.50 packs/day    Types: Cigarettes  . Smokeless tobacco: None  . Alcohol Use: Yes    Review of Systems Constitutional: Positive fever and chills ENT: No sore throat. Cardiovascular: Positive for chest wall Respiratory: Denies shortness of breath. Positive for cough Gastrointestinal: No abdominal pain.  No nausea, no vomiting.  No diarrhea.  No constipation. Genitourinary: Negative for dysuria. Musculoskeletal: Positive for 6 Skin: Negative for rash. Neurological: Negative for headaches, focal weakness or numbness.  10-point ROS otherwise negative.  ____________________________________________   PHYSICAL EXAM:  VITAL SIGNS: ED Triage Vitals  Enc Vitals Group     BP 09/30/15 1019 100/45 mmHg     Pulse Rate 09/30/15 1019 98     Resp 09/30/15 1019 18     Temp 09/30/15 1019 98 F (36.7 C)     Temp Source 09/30/15 1019 Oral  SpO2 09/30/15 1019 100 %     Weight 09/30/15 1019 130 lb (58.968 kg)     Height --      Head Cir --      Peak Flow --      Pain Score 09/30/15 1020 8     Pain Loc --      Pain Edu? --      Excl. in GC? --     Constitutional: Alert and oriented. Well appearing and in no acute distress. Head: Atraumatic. Nose: No congestion/rhinnorhea. Mouth/Throat: Mucous membranes are moist.  Oropharynx non-erythematous. Neck: No stridor. Full range of motion nontender  Cardiovascular: Normal rate, regular rhythm. Grossly normal heart sounds.  Good peripheral circulation. Respiratory: Normal respiratory effort.  No retractions. Lungs decreased breath sounds right greater than left with mild rhonchi noted Musculoskeletal: No lower extremity tenderness nor edema.  No joint effusions. Neurologic:  Normal speech and language. No gross focal neurologic deficits are appreciated. No gait  instability. Skin:  Skin is warm, dry and intact. No rash noted. Psychiatric: Mood and affect are normal. Speech and behavior are normal.  ____________________________________________   LABS (all labs ordered are listed, but only abnormal results are displayed)  Labs Reviewed - No data to display ____________________________________________  EKG  No acute STEMI ____________________________________________  RADIOLOGY  IMPRESSION: Streaky infiltrate/ consolidation in right lower lobe highly suspicious for pneumonia. Follow-up to resolution after appropriate treatment is recommended. Question small right pleural effusion. Left lung is clear. ____________________________________________   PROCEDURES  Procedure(s) performed: None  Critical Care performed: No  ____________________________________________   INITIAL IMPRESSION / ASSESSMENT AND PLAN / ED COURSE  Pertinent labs & imaging results that were available during my care of the patient were reviewed by me and considered in my medical decision making (see chart for details).  Acute right lower lobe pneumonia. Rx given for Levaquin 500 mg daily 10 days, Robitussin-AC, Motrin 800 mg. Patient follow-up with PCP and repeat chest x-ray in 3-4 weeks for clearing. ____________________________________________   FINAL CLINICAL IMPRESSION(S) / ED DIAGNOSES  Final diagnoses:  Community acquired pneumonia     This chart was dictated using voice recognition software/Dragon. Despite best efforts to proofread, errors can occur which can change the meaning. Any change was purely unintentional.   Evangeline Dakin, PA-C 09/30/15 1209  Emily Filbert, MD 09/30/15 [phone]

## 2015-09-30 NOTE — ED Notes (Addendum)
Pt to ed with c/o cough, congestion and fever x 2 days.  Pt states pain in chest with deep breath or movement and worse with cough.  Also reports decreased energy over the last 2 days.

## 2015-09-30 NOTE — Discharge Instructions (Signed)

## 2015-09-30 NOTE — ED Notes (Signed)
See triage note  Cough congestion with some fever for the past 2 days  Also having some chest discomfort with breathing  Lungs clear at present  Afebrile on arrival

## 2015-10-04 ENCOUNTER — Encounter: Payer: Self-pay | Admitting: Emergency Medicine

## 2015-10-04 ENCOUNTER — Emergency Department: Payer: Self-pay

## 2015-10-04 ENCOUNTER — Emergency Department
Admission: EM | Admit: 2015-10-04 | Discharge: 2015-10-04 | Disposition: A | Discharge status: Home / Self Care | Payer: Self-pay | Attending: Emergency Medicine | Admitting: Emergency Medicine

## 2015-10-04 DIAGNOSIS — J189 Pneumonia, unspecified organism: Secondary | ICD-10-CM | POA: Insufficient documentation

## 2015-10-04 DIAGNOSIS — F17210 Nicotine dependence, cigarettes, uncomplicated: Secondary | ICD-10-CM | POA: Insufficient documentation

## 2015-10-04 DIAGNOSIS — Z881 Allergy status to other antibiotic agents status: Secondary | ICD-10-CM | POA: Insufficient documentation

## 2015-10-04 DIAGNOSIS — Z8619 Personal history of other infectious and parasitic diseases: Secondary | ICD-10-CM | POA: Insufficient documentation

## 2015-10-04 LAB — COMPREHENSIVE METABOLIC PANEL
ALBUMIN: 2.7 g/dL — AB (ref 3.5–5.0)
ALK PHOS: 115 U/L (ref 38–126)
ALT: 19 U/L (ref 14–54)
ANION GAP: 8 (ref 5–15)
AST: 17 U/L (ref 15–41)
BUN: 8 mg/dL (ref 6–20)
CALCIUM: 8.7 mg/dL — AB (ref 8.9–10.3)
CHLORIDE: 106 mmol/L (ref 101–111)
CO2: 23 mmol/L (ref 22–32)
Creatinine, Ser: 0.49 mg/dL (ref 0.44–1.00)
GFR calc Af Amer: >60 mL/min (ref >60)
GFR calc non Af Amer: >60 mL/min (ref >60)
GLUCOSE: 96 mg/dL (ref 65–99)
POTASSIUM: 4.3 mmol/L (ref 3.5–5.1)
SODIUM: 137 mmol/L (ref 135–145)
Total Bilirubin: 0.3 mg/dL (ref 0.3–1.2)
Total Protein: 6.8 g/dL (ref 6.5–8.1)

## 2015-10-04 LAB — CBC WITH DIFFERENTIAL/PLATELET
BASOS PCT: 0 %
Basophils Absolute: 0 10*3/uL (ref 0–0.1)
EOS PCT: 0 %
Eosinophils Absolute: 0 10*3/uL (ref 0–0.7)
HEMATOCRIT: 35.9 % (ref 35.0–47.0)
Hemoglobin: 12.1 g/dL (ref 12.0–16.0)
Lymphocytes Relative: 11 %
Lymphs Abs: 1 10*3/uL (ref 1.0–3.6)
MCH: 30.4 pg (ref 26.0–34.0)
MCHC: 33.7 g/dL (ref 32.0–36.0)
MCV: 90.1 fL (ref 80.0–100.0)
MONOS PCT: 11 %
Monocytes Absolute: 1 10*3/uL — ABNORMAL HIGH (ref 0.2–0.9)
NEUTROS ABS: 7 10*3/uL — AB (ref 1.4–6.5)
NEUTROS PCT: 78 %
Platelets: 254 10*3/uL (ref 150–440)
RBC: 3.99 MIL/uL (ref 3.80–5.20)
RDW: 13.4 % (ref 11.5–14.5)
WBC: 9 10*3/uL (ref 3.6–11.0)

## 2015-10-04 MED ORDER — KETOROLAC TROMETHAMINE 30 MG/ML IJ SOLN
30.0000 mg | Freq: Once | INTRAMUSCULAR | Status: AC
  Administered 2015-10-04: 30 mg via INTRAVENOUS
  Filled 2015-10-04: qty 1

## 2015-10-04 MED ORDER — OXYCODONE-ACETAMINOPHEN 5-325 MG PO TABS: 1.0000 | ORAL_TABLET | ORAL | Status: AC | PRN

## 2015-10-04 MED ORDER — GUAIFENESIN-CODEINE 100-10 MG/5ML PO SOLN
10.0000 mL | ORAL | Status: AC | PRN
Start: 2015-10-04 — End: ?

## 2015-10-04 NOTE — ED Notes (Signed)
Recently dx'd with pneumonia   conts to have cough  But out of cough meds but is still using Ibuprofen as needed

## 2015-10-04 NOTE — ED Notes (Addendum)
Pt to ed with c/o cough, congestion, recently seen and diagnosed with pneumonia on 09/30/15.  Pt states she is in pain and "I only have ibu to use"  Also states she is out of cough syrup

## 2015-10-04 NOTE — ED Notes (Signed)
States she is having some more pain to post right shoulder and cough

## 2015-10-04 NOTE — ED Provider Notes (Signed)
[facility] Emergency Department Provider Note  ____________________________________________  Time seen: Approximately 11 AM  I have reviewed the triage vital signs and the nursing notes.   HISTORY  Chief Complaint Cough and Nasal Congestion    HPI Jacqueline Cross is a 50 y.o. female was seen by myself 4 days ago for the same. Diagnosed with right lower lobe pneumonia with possible effusion. Presents today for reevaluation states that she still having cough and congestion denies any fever. Taking her Levaquin as directed. Requested a repeat chest x-ray and a refill on her cough syrup.   Past Medical History  Diagnosis Date  . Hepatitis C virus   . Rheumatoid arthritis (HCC)   . Anxiety     There are no active problems to display for this patient.   Past Surgical History  Procedure Laterality Date  . Cesarean section      x 4  . Knee surgery Left     Current Outpatient Rx  Name  Route  Sig  Dispense  Refill  . cloNIDine (CATAPRES) 0.1 MG tablet   Oral   Take 1 tablet (0.1 mg total) by mouth 2 (two) times daily.   5 tablet   0   . guaiFENesin-codeine 100-10 MG/5ML syrup   Oral   Take 10 mLs by mouth every 4 (four) hours as needed for cough.   180 mL   1   . ibuprofen (ADVIL,MOTRIN) 800 MG tablet   Oral   Take 1 tablet (800 mg total) by mouth every 8 (eight) hours as needed.   30 tablet   0   . levofloxacin (LEVAQUIN) 500 MG tablet   Oral   Take 1 tablet (500 mg total) by mouth daily.   10 tablet   0   . magic mouthwash w/lidocaine SOLN   Oral   Take 5 mLs by mouth 4 (four) times daily.   240 mL   0     Dispense in a 1/1/1/1 ratio. Use lidocaine, diphen ...   . meloxicam (MOBIC) 15 MG tablet   Oral   Take 1 tablet (15 mg total) by mouth daily.   30 tablet   0   . MULTIPLE VITAMIN PO   Oral   Take by mouth.         . oxyCODONE-acetaminophen (ROXICET) 5-325 MG tablet   Oral   Take 1-2 tablets by mouth every 4 (four)  hours as needed for severe pain.   15 tablet   0     Allergies Sulfa antibiotics  History reviewed. No pertinent family history.  Social History Social History  Substance Use Topics  . Smoking status: Current Every Day Smoker -- 0.50 packs/day    Types: Cigarettes  . Smokeless tobacco: None  . Alcohol Use: Yes    Review of Systems Constitutional: No fever/chills Eyes: No visual changes. ENT: No sore throat. Positive for congestion Cardiovascular: Denies chest pain. Respiratory: Denies shortness of breath. Positive for cough Gastrointestinal: No abdominal pain.  No nausea, no vomiting.  No diarrhea.  No constipation. Genitourinary: Negative for dysuria. Musculoskeletal: Negative for back pain. Positive for generalized aching and pains. Skin: Negative for rash. Neurological: Negative for headaches, focal weakness or numbness.  10-point ROS otherwise negative.  ____________________________________________   PHYSICAL EXAM:  VITAL SIGNS: ED Triage Vitals  Enc Vitals Group     BP 10/04/15 1118 107/60 mmHg     Pulse Rate 10/04/15 1118 67     Resp 10/04/15 1118 18  Temp 10/04/15 1118 97.8 F (36.6 C)     Temp Source 10/04/15 1118 Oral     SpO2 10/04/15 1118 100 %     Weight 10/04/15 1118 125 lb (56.7 kg)     Height 10/04/15 1118 5\' 5"  (1.651 m)     Head Cir --      Peak Flow --      Pain Score 10/04/15 1118 8     Pain Loc --      Pain Edu? --      Excl. in GC? --     Constitutional: Alert and oriented. Well appearing and in no acute distress. Eyes: Conjunctivae are normal. PERRL. EOMI. Head: Atraumatic. Nose: No congestion/rhinnorhea. Mouth/Throat: Mucous membranes are moist.  Oropharynx non-erythematous. Neck: No stridor.  Full range of motion nontender Cardiovascular: Normal rate, regular rhythm. Grossly normal heart sounds.  Good peripheral circulation. Respiratory: Normal respiratory effort.  No retractions. Lungs decreased breath sounds right greater  than left with rhonchi noted Musculoskeletal: No lower extremity tenderness nor edema.  No joint effusions. Neurologic:  Normal speech and language. No gross focal neurologic deficits are appreciated. No gait instability. Skin:  Skin is warm, dry and intact. No rash noted. Psychiatric: Mood and affect are normal. Speech and behavior are normal.  ____________________________________________   LABS (all labs ordered are listed, but only abnormal results are displayed)  Labs Reviewed  COMPREHENSIVE METABOLIC PANEL - Abnormal; Notable for the following:    Calcium 8.7 (*)    Albumin 2.7 (*)    All other components within normal limits  CBC WITH DIFFERENTIAL/PLATELET - Abnormal; Notable for the following:    Neutro Abs 7.0 (*)    Monocytes Absolute 1.0 (*)    All other components within normal limits    RADIOLOGY  Continued right middle lobe and right lower lobe pneumonia. ____________________________________________   PROCEDURES  Procedure(s) performed: None  Critical Care performed: No  ____________________________________________   INITIAL IMPRESSION / ASSESSMENT AND PLAN / ED COURSE  Pertinent labs & imaging results that were available during my care of the patient were reviewed by me and considered in my medical decision making (see chart for details).  2 needed for right middle and lower lobe pneumonia. Continue taking Levaquin as prescribed from previous visit refill given for Robitussin-AC. Patient follow-up with PCP or return to the ER with any worsening symptomology. She voices no other emergency medical complaints at this time. ____________________________________________   FINAL CLINICAL IMPRESSION(S) / ED DIAGNOSES  Final diagnoses:  Community acquired pneumonia     This chart was dictated using voice recognition software/Dragon. Despite best efforts to proofread, errors can occur which can change the meaning. Any change was purely  unintentional.   10/06/15, PA-C 10/04/15 1819  10/06/15, MD 10/04/15 2019

## 2015-10-04 NOTE — Discharge Instructions (Signed)

## 2017-10-24 IMAGING — CR DG CHEST 2V
1 series · 2 of 2 positions shown · non-contrast
Comparison: 09/30/2015

CLINICAL DATA: Pneumonia diagnosed 3 days ago.  Feels worse.

EXAM:
CHEST  2 VIEW

[Series 1: w chest pa · 0.14mm/px · 2 of 2 slices shown]
[im 1/2]
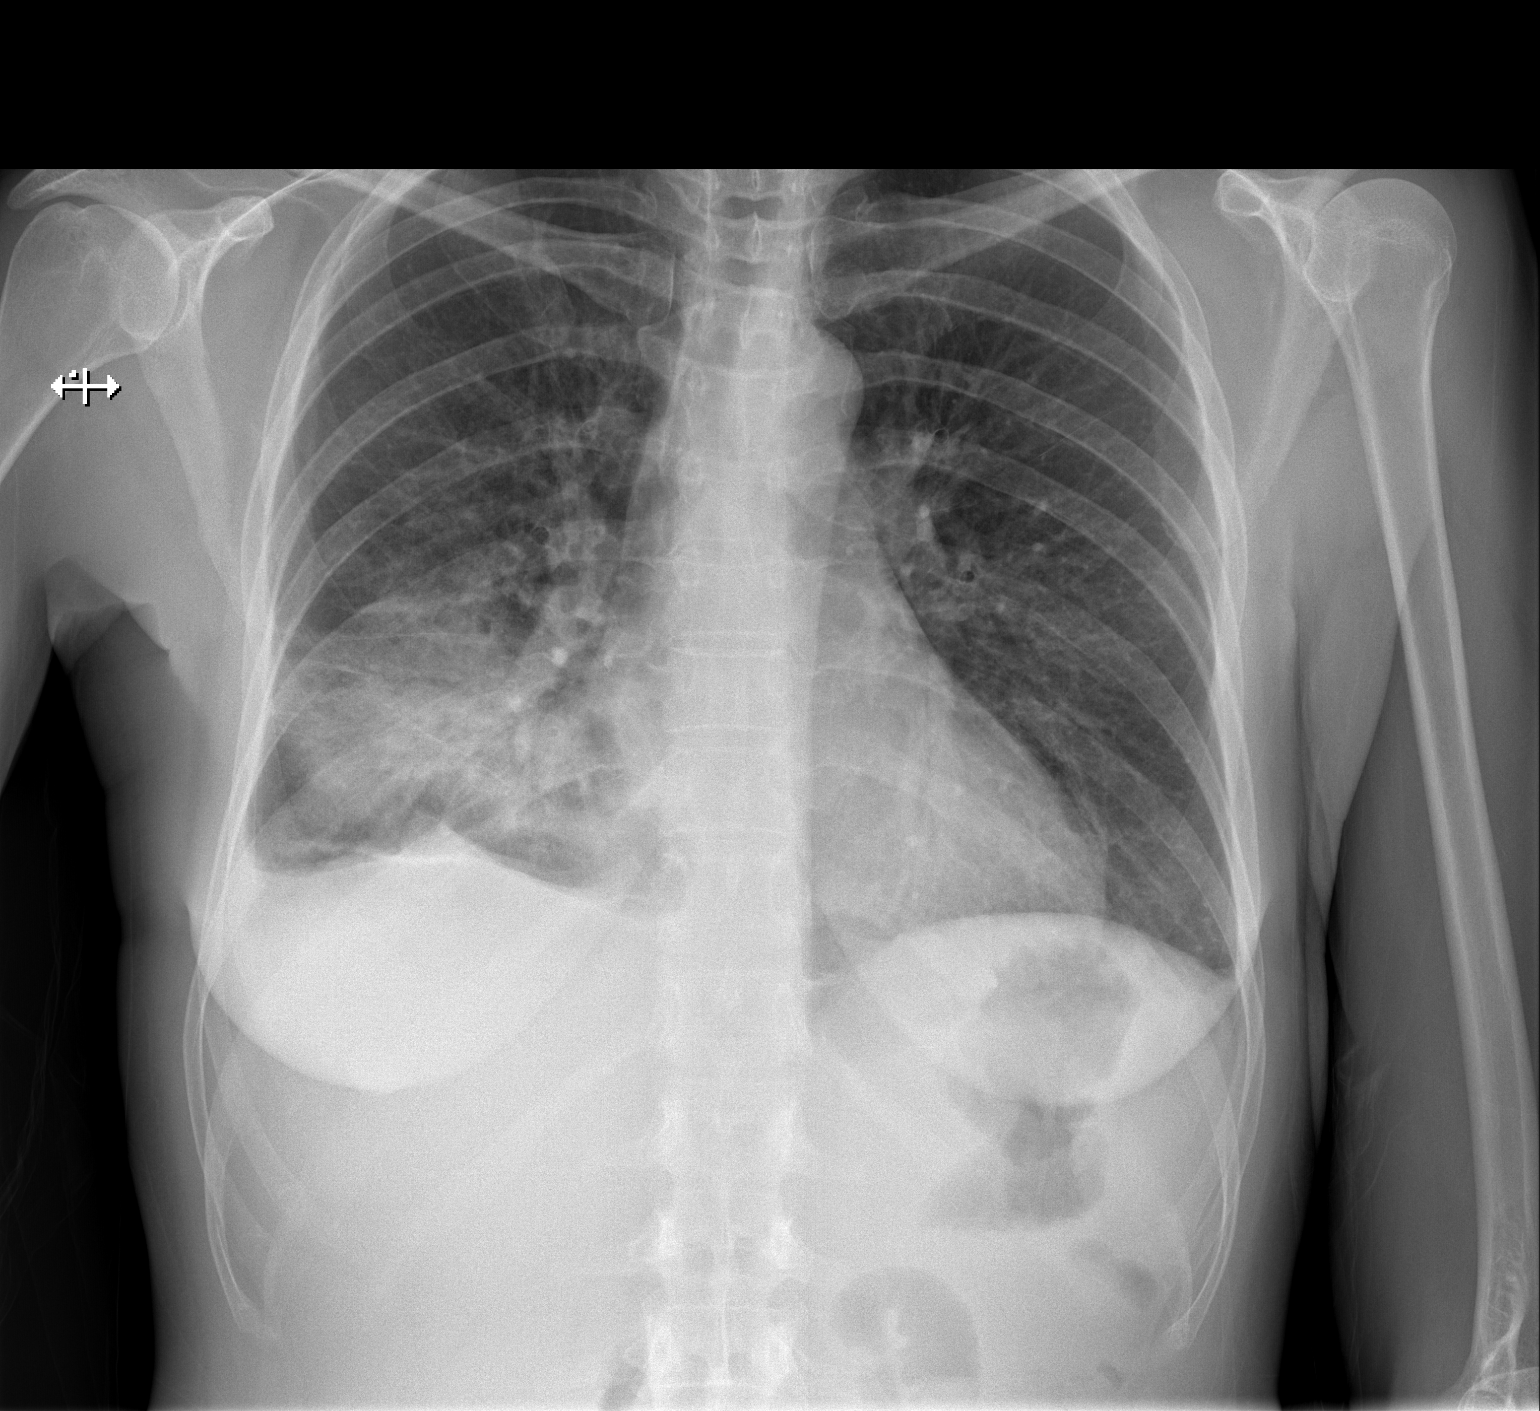
[im 2/2]
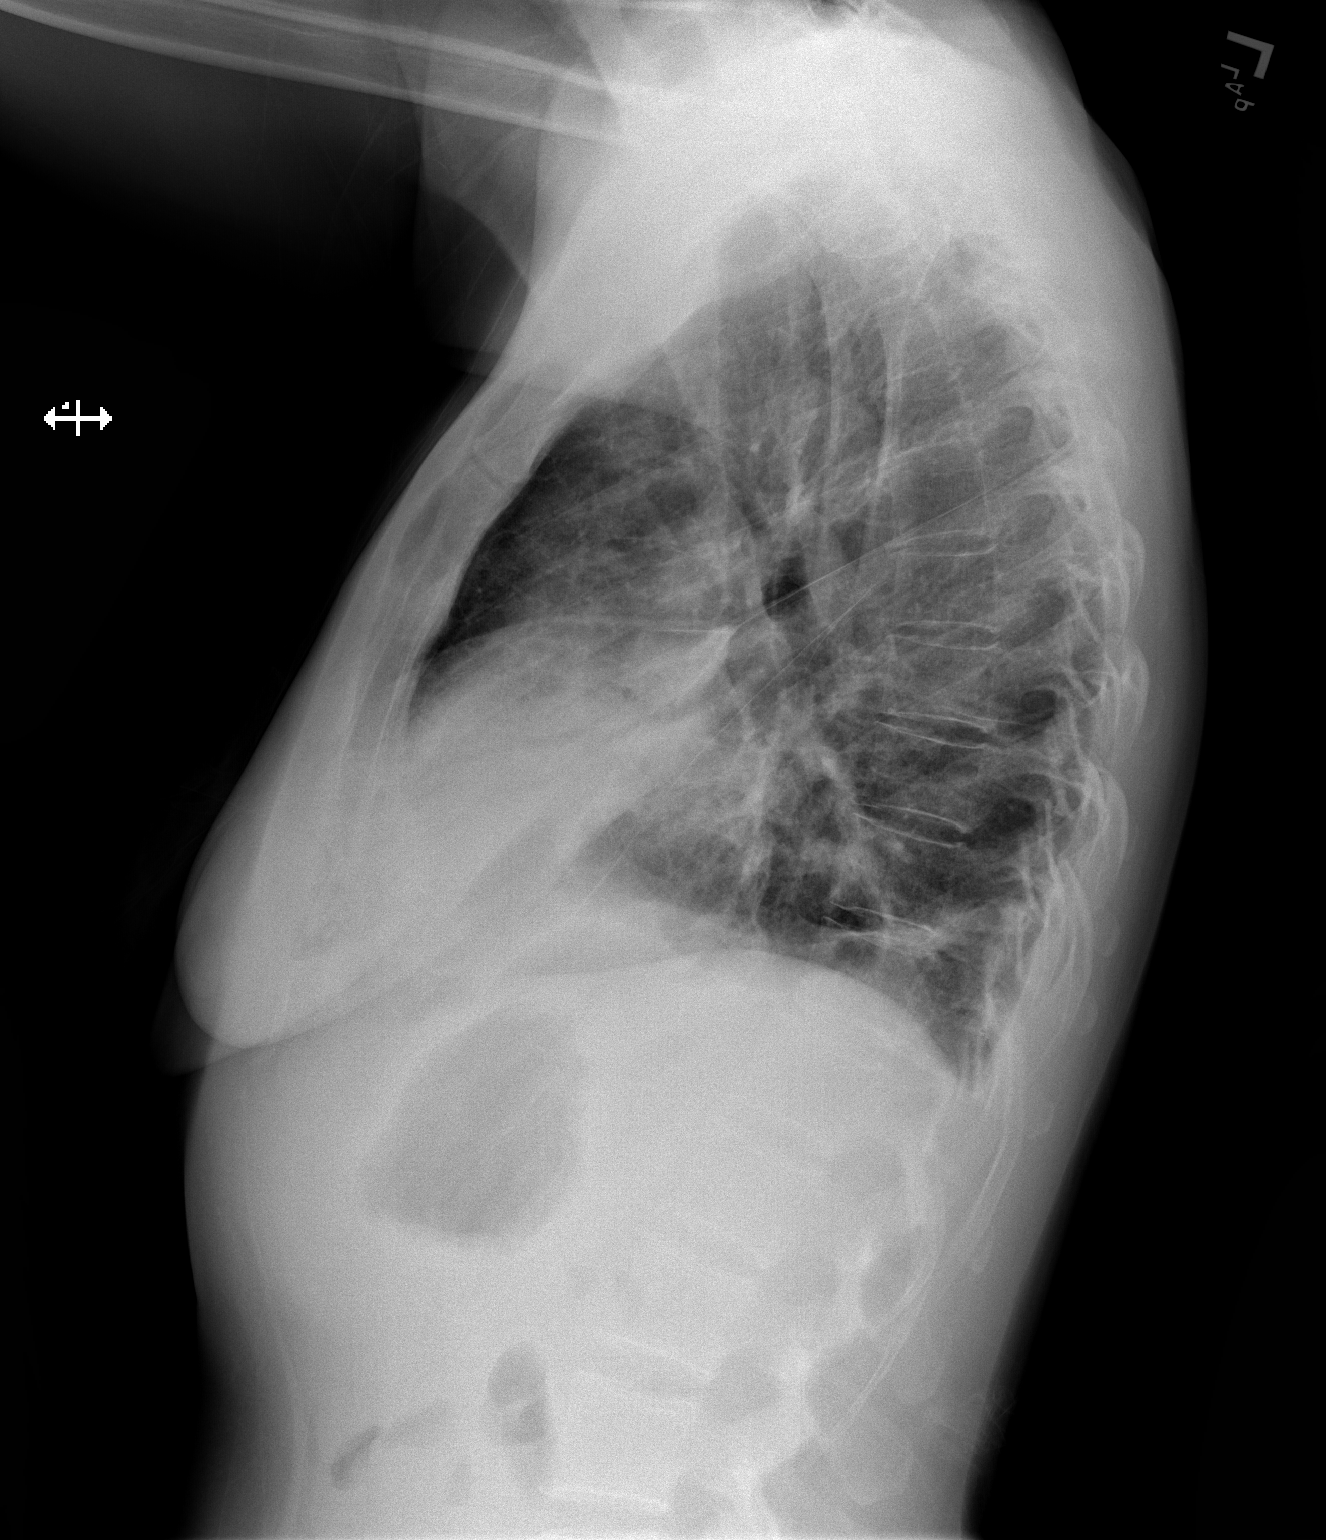

[2 of 2 positions shown; findings below may reference images not displayed]

FINDINGS: Right middle lobe and right lower lobe airspace disease. Small right
pleural effusion. No left pleural effusion. No pneumothorax. Stable
cardiomediastinal silhouette. No acute osseous abnormality.
IMPRESSION: Right middle lobe and right lower lobe pneumonia with a small
parapneumonic effusion.
